# Patient Record
Sex: Male | Born: 1970 | Hispanic: Yes | Smoking: Never smoker
Health system: Southern US, Community
[De-identification: ages and names within clinical notes are randomized; demographics above are authoritative.]

## PROBLEM LIST (undated history)

## (undated) DIAGNOSIS — E119 Type 2 diabetes mellitus without complications: Secondary | ICD-10-CM

---

## 2019-09-24 ENCOUNTER — Telehealth: Payer: Self-pay | Admitting: Pediatric Intensive Care

## 2019-09-24 ENCOUNTER — Telehealth (INDEPENDENT_AMBULATORY_CARE_PROVIDER_SITE_OTHER): Payer: Self-pay

## 2019-09-24 NOTE — Telephone Encounter (Signed)
Message received from [place] requesting appointment for patient to establish care.  Informed her appt scheduled for 10/09/2019 @ 0850 @ [facility]

## 2019-09-24 NOTE — Telephone Encounter (Signed)
Call to client with [facility] interpretation [place]. Client ID verified x2. Client states he had "leg surgery" 20 years ago and was supposed to follow up at 20 year mark for evaluation of hardware. Client denies pain in left lower extremity. Client requests PCP and ortho referral. CN advised that she will make PCP referral and send out [employer] to client and his partner. Client consents to have referral to care via CNP. Lisette Abu RN BSN CNP [phone]

## 2019-09-24 NOTE — Telephone Encounter (Signed)
Call to client with [facility] interpretation [place]- client ID verified x2. Client states that he had "leg surgery" 20 years ago and that he was supposed to have follow up regarding the surgery and hardware at 20 year mark. Cl

## 2019-10-09 ENCOUNTER — Ambulatory Visit (INDEPENDENT_AMBULATORY_CARE_PROVIDER_SITE_OTHER): Payer: Self-pay | Admitting: Primary Care

## 2021-03-20 ENCOUNTER — Emergency Department (HOSPITAL_COMMUNITY)
Admission: EM | Admit: 2021-03-20 | Discharge: 2021-03-20 | Disposition: A | Discharge status: Home / Self Care | Payer: Self-pay | Attending: Emergency Medicine | Admitting: Emergency Medicine

## 2021-03-20 ENCOUNTER — Other Ambulatory Visit: Payer: Self-pay

## 2021-03-20 ENCOUNTER — Emergency Department (HOSPITAL_COMMUNITY): Payer: Self-pay

## 2021-03-20 DIAGNOSIS — Y9289 Other specified places as the place of occurrence of the external cause: Secondary | ICD-10-CM | POA: Insufficient documentation

## 2021-03-20 DIAGNOSIS — W228XXA Striking against or struck by other objects, initial encounter: Secondary | ICD-10-CM | POA: Insufficient documentation

## 2021-03-20 DIAGNOSIS — B029 Zoster without complications: Secondary | ICD-10-CM | POA: Insufficient documentation

## 2021-03-20 DIAGNOSIS — R0789 Other chest pain: Secondary | ICD-10-CM | POA: Insufficient documentation

## 2021-03-20 DIAGNOSIS — R52 Pain, unspecified: Secondary | ICD-10-CM

## 2021-03-20 DIAGNOSIS — Z7984 Long term (current) use of oral hypoglycemic drugs: Secondary | ICD-10-CM | POA: Insufficient documentation

## 2021-03-20 DIAGNOSIS — R21 Rash and other nonspecific skin eruption: Secondary | ICD-10-CM | POA: Insufficient documentation

## 2021-03-20 DIAGNOSIS — R7309 Other abnormal glucose: Secondary | ICD-10-CM | POA: Insufficient documentation

## 2021-03-20 DIAGNOSIS — R030 Elevated blood-pressure reading, without diagnosis of hypertension: Secondary | ICD-10-CM | POA: Insufficient documentation

## 2021-03-20 LAB — CBC
HCT: 48.3 % (ref 39.0–52.0)
Hemoglobin: 16.6 g/dL (ref 13.0–17.0)
MCH: 30.9 pg (ref 26.0–34.0)
MCHC: 34.4 g/dL (ref 30.0–36.0)
MCV: 89.9 fL (ref 80.0–100.0)
Platelets: 284 10*3/uL (ref 150–400)
RBC: 5.37 MIL/uL (ref 4.22–5.81)
RDW: 11.9 % (ref 11.5–15.5)
WBC: 5.8 10*3/uL (ref 4.0–10.5)
nRBC: 0 % (ref 0.0–0.2)

## 2021-03-20 LAB — BASIC METABOLIC PANEL
Anion gap: 7 (ref 5–15)
BUN: 14 mg/dL (ref 6–20)
CO2: 26 mmol/L (ref 22–32)
Calcium: 9.3 mg/dL (ref 8.9–10.3)
Chloride: 100 mmol/L (ref 98–111)
Creatinine, Ser: 0.83 mg/dL (ref 0.61–1.24)
GFR, Estimated: >60 mL/min (ref >60)
Glucose, Bld: 478 mg/dL — ABNORMAL HIGH (ref 70–99)
Potassium: 4 mmol/L (ref 3.5–5.1)
Sodium: 133 mmol/L — ABNORMAL LOW (ref 135–145)

## 2021-03-20 LAB — TROPONIN I (HIGH SENSITIVITY)
Troponin I (High Sensitivity): 4 ng/L (ref <18)
Troponin I (High Sensitivity): 4 ng/L (ref <18)

## 2021-03-20 MED ORDER — NAPROXEN 500 MG PO TABS: 500.0000 mg | ORAL_TABLET | Freq: Two times a day (BID) | ORAL | 0 refills | Status: DC

## 2021-03-20 MED ORDER — VALACYCLOVIR HCL 1 G PO TABS: 1000.0000 mg | ORAL_TABLET | Freq: Three times a day (TID) | ORAL | 0 refills | Status: AC

## 2021-03-20 MED ORDER — VALACYCLOVIR HCL 1 G PO TABS: 1000.0000 mg | ORAL_TABLET | Freq: Three times a day (TID) | ORAL | 0 refills | Status: DC

## 2021-03-20 MED ORDER — METFORMIN HCL 500 MG PO TABS: 500.0000 mg | ORAL_TABLET | Freq: Every day | ORAL | 1 refills | Status: DC

## 2021-03-20 MED ORDER — MORPHINE SULFATE (PF) 4 MG/ML IV SOLN
4.0000 mg | Freq: Once | INTRAVENOUS | Status: AC
Start: 2021-03-20 — End: 2021-03-20
  Administered 2021-03-20: 4 mg via INTRAVENOUS
  Filled 2021-03-20: qty 1

## 2021-03-20 MED ORDER — VALACYCLOVIR HCL 500 MG PO TABS
1000.0000 mg | ORAL_TABLET | Freq: Once | ORAL | Status: AC
  Administered 2021-03-20: 1000 mg via ORAL
  Filled 2021-03-20: qty 2

## 2021-03-20 NOTE — ED Notes (Signed)
Patient transported to X-ray 

## 2021-03-20 NOTE — ED Provider Notes (Signed)
[facility] EMERGENCY DEPARTMENT Provider Note   CSN: [id number] Arrival date & time: 03/20/21  3716     History Chief Complaint  Patient presents with  . Chest Pain  . Rash    Dylan Baxter is a 50 y.o. 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 Patient notes pain is intermittent and worse with palpation. He has taken Advil with moderate relief. Denies central chest pain. Denies associated shortness of breath, diaphoresis, nausea, and vomiting. He has not medical condition; however does not see a PCP. Denies tobacco and drug use. No cardiac history.  Denies history of blood clots, recent surgeries, recent long immobilizations, and hormonal treatments.  No lower extremity edema.   Patient also admits to a pruritic rash on there right side of his back below his shoulder blade that has been present for the past week. He has not received his shingles vaccine. No fever or chills. No treatment prior to arrival.   History obtained from patient and past medical records. Official Engineer, structural used during entire encounter.     No past medical history on file.  There are no problems to display for this patient.     No family history on file.     Home Medications Prior to Admission medications   Medication Sig Start Date End Date Taking? Authorizing Provider  metFORMIN (GLUCOPHAGE) 500 MG tablet Take 1 tablet (500 mg total) by mouth daily. 03/20/21  Yes Daria Mcmeekin C, PA-C  naproxen (NAPROSYN) 500 MG tablet Take 1 tablet (500 mg total) by mouth 2 (two) times daily. 03/20/21  Yes Zeinab Rodwell, Merla Riches, PA-C  valACYclovir (VALTREX) 1000 MG tablet Take 1 tablet (1,000 mg total) by mouth 3 (three) times daily. 03/20/21  Yes Mannie Stabile, PA-C    Allergies    Patient has no known allergies.  Review of Systems   Review of Systems   Constitutional: Negative for chills and fever.  Respiratory: Negative for shortness of breath.   Cardiovascular: Positive for chest pain. Negative for leg swelling.  Gastrointestinal: Negative for abdominal pain, nausea and vomiting.  Skin: Positive for rash.  Neurological: Negative for numbness.  All other systems reviewed and are negative.   Physical Exam Updated Vital Signs BP (!) 162/85   Pulse (!) 57   Temp 98 F (36.7 C) (Oral)   Resp 14   SpO2 98%   Physical Exam Vitals and nursing note reviewed.  Constitutional:      General: He is not in acute distress.    Appearance: He is not ill-appearing.  HENT:     Head: Normocephalic.  Eyes:     Pupils: Pupils are equal, round, and reactive to light.  Cardiovascular:     Rate and Rhythm: Normal rate and regular rhythm.     Pulses: Normal pulses.     Heart sounds: Normal heart sounds. No murmur heard. No friction rub. No gallop.   Pulmonary:     Effort: Pulmonary effort is normal.     Breath sounds: Normal breath sounds.     Comments: Respirations equal and unlabored, patient able to speak in full sentences, lungs clear to auscultation bilaterally Chest:       Comments: Reproducible tenderness throughout right pectoral muscle into ride side of chest wall. No crepitus or deformity. Abdominal:     General: Abdomen is flat. Bowel sounds are  normal. There is no distension.     Palpations: Abdomen is soft.     Tenderness: There is no abdominal tenderness. There is no guarding or rebound.  Musculoskeletal:     Cervical back: Neck supple.     Comments: No lower extremity edema. Negative homan sign bilaterally.  Skin:    Comments: Erythematous papular rash in right thoracic region.  Neurological:     General: No focal deficit present.     Mental Status: He is alert.  Psychiatric:        Mood and Affect: Mood normal.        Behavior: Behavior normal.     ED Results / Procedures / Treatments   Labs (all labs ordered  are listed, but only abnormal results are displayed) Labs Reviewed  BASIC METABOLIC PANEL - Abnormal; Notable for the following components:      Result Value   Sodium 133 (*)    Glucose, Bld 478 (*)    All other components within normal limits  CBC  TROPONIN I (HIGH SENSITIVITY)  TROPONIN I (HIGH SENSITIVITY)    EKG None  Radiology DG Chest 2 View  Result Date: 03/20/2021 CLINICAL DATA:  Chest pain and shortness of breath EXAM: CHEST - 2 VIEW COMPARISON:  None. FINDINGS: Lungs are clear. Heart size and pulmonary vascularity are normal. No adenopathy. There is degenerative change in the midthoracic region. No pneumothorax. IMPRESSION: Lungs clear.  Heart size normal.  No evident adenopathy. Electronically Signed   By: Bretta Bang III M.D.   On: 03/20/2021 09:25   DG Ribs Unilateral Right  Result Date: 03/20/2021 CLINICAL DATA:  Anterior right rib pain since an injury involving a forklift 03/18/2021. EXAM: RIGHT RIBS - 2 VIEW COMPARISON:  None. FINDINGS: No fracture or other bone lesions are seen involving the ribs. IMPRESSION: Negative exam. Electronically Signed   By: Drusilla Kanner M.D.   On: 03/20/2021 13:20    Procedures Procedures   Medications Ordered in ED Medications  morphine 4 MG/ML injection 4 mg (has no administration in time range)  valACYclovir (VALTREX) tablet 1,000 mg (has no administration in time range)    ED Course  I have reviewed the triage vital signs and the nursing notes.  Pertinent labs & imaging results that were available during my care of the patient were reviewed by me and considered in my medical decision making (see chart for details).  Clinical Course as of 03/20/21 1412  Mon Mar 20, 2021  1218 Sodium(!): 133 [CA]  1218 Glucose(!): 478 [CA]  1218 Troponin I (High Sensitivity): 4 [CA]    Clinical Course User Index [CA] Mannie Stabile, PA-C   MDM Rules/Calculators/A&P                         50 year old male presents to the  ED due to right-sided chest pain x2 days after being hit in the chest with a forklift.  Denies associated shortness of breath, nausea, vomiting, diaphoresis.  Patient also admits to a pruritic rash on the right side of his back x1 week.  He has not received his shingles vaccine.  Upon arrival, stable vitals.  Patient hypertensive which has improved throughout patient's ED stay.  Suspect underlying hypertension however, patient does not have a PCP.  Low suspicion for hypertensive urgency/emergency given patient is asymptomatic.  Patient in no acute distress and nontoxic-appearing.  Physical exam significant for reproducible tenderness throughout right pectoral muscle and into right side  of chest wall without crepitus or deformity.  No ecchymosis.  Erythematous, papular rash on right thoracic region of back consistent with shingles.  Routine labs, troponin, chest x-ray, and EKG ordered at triage.  Will obtain unilateral right side rib x-ray to rule out rib fracture given history of trauma.  Morphine given for pain.  CBC unremarkable no leukocytosis and normal hemoglobin.  Initial troponin normal at 4.  Will obtain delta troponin to rule out ACS.  BMP significant for mild hyponatremia 133.  Hyperglycemia at 478 with no anion gap.  Normal renal function.  Suspect underlying diabetes.  Personally reviewed which is negative for signs of pneumonia, pneumothorax, or widened mediastinum.  EKG personally reviewed which demonstrates normal sinus rhythm with no signs of acute ischemia.  Delta troponin flat. Doubt ACS. Suspect pain related to MSK etiology given history of trauma and reproducible nature on exam. Patient not hypoxic or tachycardic. Low suspicion for PE/DVT. Right rib x-ray personally reviewed which is negative for any bony fractures. Patient discharged with pain medication and valacyclovir for shingles. Patient also discharged with metformin. [facility] number given to patient at discharge. Advised patient  to call today to schedule an appointment to establish care. Strict ED precautions discussed with patient. Patient states understanding and agrees to plan. Patient discharged home in no acute distress and stable vitals  Final Clinical Impression(s) / ED Diagnoses Final diagnoses:  Chest wall pain  Herpes zoster without complication  Elevated glucose  Elevated blood pressure reading    Rx / DC Orders ED Discharge Orders         Ordered    valACYclovir (VALTREX) 1000 MG tablet  3 times daily        03/20/21 1402    naproxen (NAPROSYN) 500 MG tablet  2 times daily        03/20/21 1402    metFORMIN (GLUCOPHAGE) 500 MG tablet  Daily        03/20/21 1402           Jesusita Oka 03/20/21 1431    Pollyann Savoy, MD 03/20/21 [phone]

## 2021-03-20 NOTE — ED Triage Notes (Signed)
C/O right sided chest pain started about 3-4 days ago that comes and go. 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 Denies PMH other than hip surgery years ago. C/O rash on back side as well,

## 2021-03-20 NOTE — Discharge Instructions (Addendum)
As discussed, all of your x-rays and labs are reassuring today. Your x-ray did not show any broken ribs.  Your rash is most likely shingles.  I am sending you home with a medication for shingles.  Take 3 times a day for 7 days.  I am also sending you home with a pain medication.  Take twice a day as needed for pain.  Do not mix with other over-the-counter pain medications.  I have included the number for [facility].  Please call today to schedule an appointment to establish care with a primary care doctor.  Your glucose and blood pressure was elevated while here in the ER.  I am sending home with Metformin for possible diabetes. Take daily. Return to the ER for new or worsening symptoms.

## 2021-04-06 ENCOUNTER — Encounter (HOSPITAL_COMMUNITY): Payer: Self-pay

## 2021-04-06 ENCOUNTER — Other Ambulatory Visit: Payer: Self-pay

## 2021-04-06 ENCOUNTER — Emergency Department (HOSPITAL_COMMUNITY): Payer: Self-pay

## 2021-04-06 ENCOUNTER — Emergency Department (HOSPITAL_COMMUNITY)
Admission: EM | Admit: 2021-04-06 | Discharge: 2021-04-07 | Disposition: A | Discharge status: Home / Self Care | Payer: Self-pay | Attending: Emergency Medicine | Admitting: Emergency Medicine

## 2021-04-06 DIAGNOSIS — E119 Type 2 diabetes mellitus without complications: Secondary | ICD-10-CM | POA: Insufficient documentation

## 2021-04-06 DIAGNOSIS — R21 Rash and other nonspecific skin eruption: Secondary | ICD-10-CM | POA: Insufficient documentation

## 2021-04-06 DIAGNOSIS — R0602 Shortness of breath: Secondary | ICD-10-CM | POA: Insufficient documentation

## 2021-04-06 DIAGNOSIS — Z7984 Long term (current) use of oral hypoglycemic drugs: Secondary | ICD-10-CM | POA: Insufficient documentation

## 2021-04-06 DIAGNOSIS — R079 Chest pain, unspecified: Secondary | ICD-10-CM

## 2021-04-06 HISTORY — DX: Type 2 diabetes mellitus without complications: E11.9

## 2021-04-06 LAB — BASIC METABOLIC PANEL
Anion gap: 6 (ref 5–15)
BUN: 13 mg/dL (ref 6–20)
CO2: 26 mmol/L (ref 22–32)
Calcium: 9 mg/dL (ref 8.9–10.3)
Chloride: 102 mmol/L (ref 98–111)
Creatinine, Ser: 0.63 mg/dL (ref 0.61–1.24)
GFR, Estimated: >60 mL/min (ref >60)
Glucose, Bld: 138 mg/dL — ABNORMAL HIGH (ref 70–99)
Potassium: 3.5 mmol/L (ref 3.5–5.1)
Sodium: 134 mmol/L — ABNORMAL LOW (ref 135–145)

## 2021-04-06 LAB — TROPONIN I (HIGH SENSITIVITY): Troponin I (High Sensitivity): 4 ng/L (ref <18)

## 2021-04-06 LAB — CBC
HCT: 43.5 % (ref 39.0–52.0)
Hemoglobin: 15 g/dL (ref 13.0–17.0)
MCH: 31.3 pg (ref 26.0–34.0)
MCHC: 34.5 g/dL (ref 30.0–36.0)
MCV: 90.6 fL (ref 80.0–100.0)
Platelets: 315 10*3/uL (ref 150–400)
RBC: 4.8 MIL/uL (ref 4.22–5.81)
RDW: 12.3 % (ref 11.5–15.5)
WBC: 6.1 10*3/uL (ref 4.0–10.5)
nRBC: 0 % (ref 0.0–0.2)

## 2021-04-06 NOTE — ED Triage Notes (Signed)
Emergency Medicine Provider Triage Evaluation Note  Dylan Baxter , a 50 y.o. male  was evaluated in triage.  Pt complains of chest pain.  Review of Systems  Positive: Chest pain, shortness of breath Negative: Cough, fevers, chills, dizziness, syncope  Physical Exam  BP (!) 180/100 (BP Location: Right Arm)   Pulse 71   Temp 98.2 F (36.8 C)   Resp 15   SpO2 98%  Gen:   Awake, no distress   HEENT:  Atraumatic  Resp:  Normal effort  Cardiac:  Normal rate, reducible right-sided chest wall tenderness Abd:   Nondistended, nontender  MSK:   Moves extremities without difficulty  Neuro:  Speech clear   Medical Decision Making  Medically screening exam initiated at 9:45 PM.  Appropriate orders placed.  Dylan Baxter was informed that the remainder of the evaluation will be completed by another provider, this initial triage assessment does not replace that evaluation, and the importance of remaining in the ED until their evaluation is complete.  Clinical Impression  50 year old male here for chest pain.  Plan for labs, troponin.  He is hypertensive with blood pressure 180/100.  Stable for further evaluation.   Mare Ferrari, PA-C 04/06/21 2148

## 2021-04-06 NOTE — ED Triage Notes (Signed)
Patient reports intermittent R sided chest pain x 3 weeks, was seen for the same and told to come back if it continued, reports some shortness of breath at rest, denies cough and fever

## 2021-04-07 LAB — TROPONIN I (HIGH SENSITIVITY): Troponin I (High Sensitivity): 4 ng/L (ref <18)

## 2021-04-07 LAB — D-DIMER, QUANTITATIVE: D-Dimer, Quant: <0.27 ug/mL-FEU (ref 0.00–0.50)

## 2021-04-07 MED ORDER — METHOCARBAMOL 500 MG PO TABS: 500.0000 mg | ORAL_TABLET | Freq: Two times a day (BID) | ORAL | 0 refills | Status: AC | PRN

## 2021-04-07 MED ORDER — NAPROXEN 500 MG PO TABS: 500.0000 mg | ORAL_TABLET | Freq: Two times a day (BID) | ORAL | 0 refills | Status: AC

## 2021-04-07 NOTE — Discharge Instructions (Addendum)
Toma naproxen 2 veces cada dia por dolor.  Toma robaxin por dolor de los musculos. Ten cuidado, puede causar cansado.  Da Dylan Baxter cita con la clinica debajo.  Regresa a la sala de emergencia con sintomas nuevos.

## 2021-04-07 NOTE — ED Provider Notes (Signed)
[facility] EMERGENCY DEPARTMENT Provider Note   CSN: [id number] Arrival date & time: 04/06/21  2134     History Chief Complaint  Patient presents with  . Chest Pain    Dylan Baxter is a 50 y.o. male presenting for evaluation of chest pain.  Patient states for the past few weeks he has had intermittent chest pain on the right side.  It is worse when he lays down at night and with certain movements of his right arm.  He has been seen in the ER as well as his PCP for this.  He is on medicine now with pain, does not know the name or mechanism.  He is here today because the pain has continued and continues to worsen.  He does have increased pain with inspiration.  He reports associated shortness of breath, although this may be due to pain.  He denies fevers, chills, cough, nausea, vomiting, abdominal pain, urinary symptoms.  He denies leg pain or swelling.  He has a history of diabetes for which he takes medication, no other medical problems.  He denies radiation of pain.  Additional history obtained from chart review.  I reviewed recent ED visit for the same.   HPI     Past Medical History:  Diagnosis Date  . Diabetes mellitus without complication (HCC)     There are no problems to display for this patient.   History reviewed. No pertinent surgical history.     History reviewed. No pertinent family history.  Social History   Tobacco Use  . Smoking status: Never Smoker  . Smokeless tobacco: Never Used  Substance Use Topics  . Alcohol use: Yes    Comment: socially   . Drug use: Never    Home Medications Prior to Admission medications   Medication Sig Start Date End Date Taking? Authorizing Provider  methocarbamol (ROBAXIN) 500 MG tablet Take 1 tablet (500 mg total) by mouth 2 (two) times daily as needed for muscle spasms. 04/07/21  Yes Dusti Tetro, PA-C  metFORMIN (GLUCOPHAGE) 500 MG tablet Take 1 tablet (500 mg total) by mouth daily.  03/20/21   Mannie Stabile, PA-C  naproxen (NAPROSYN) 500 MG tablet Take 1 tablet (500 mg total) by mouth 2 (two) times daily. 04/07/21   Ellianna Ruest, PA-C  valACYclovir (VALTREX) 1000 MG tablet Take 1 tablet (1,000 mg total) by mouth 3 (three) times daily. 03/20/21   Mannie Stabile, PA-C    Allergies    Patient has no known allergies.  Review of Systems   Review of Systems  Respiratory: Positive for shortness of breath.   Cardiovascular: Positive for chest pain.  All other systems reviewed and are negative.   Physical Exam Updated Vital Signs BP (!) 156/88 (BP Location: Left Arm)   Pulse (!) 58   Temp 98.2 F (36.8 C) (Oral)   Resp 18   SpO2 97%   Physical Exam Vitals and nursing note reviewed.  Constitutional:      General: He is not in acute distress.    Appearance: He is well-developed.     Comments: Resting in the bed in NAD  HENT:     Head: Normocephalic and atraumatic.  Eyes:     Conjunctiva/sclera: Conjunctivae normal.     Pupils: Pupils are equal, round, and reactive to light.  Cardiovascular:     Rate and Rhythm: Normal rate and regular rhythm.     Pulses: Normal pulses.  Pulmonary:     Effort:  Pulmonary effort is normal. No respiratory distress.     Breath sounds: Normal breath sounds. No wheezing.  Abdominal:     General: There is no distension.     Palpations: Abdomen is soft. There is no mass.     Tenderness: There is no abdominal tenderness. There is no guarding or rebound.  Musculoskeletal:        General: Normal range of motion.     Cervical back: Normal range of motion and neck supple.       Back:  Skin:    General: Skin is warm and dry.     Capillary Refill: Capillary refill takes less than 2 seconds.     Findings: Rash present.     Comments: Dried rash of the R posterior chest wall.   Neurological:     Mental Status: He is alert and oriented to person, place, and time.     ED Results / Procedures / Treatments   Labs (all  labs ordered are listed, but only abnormal results are displayed) Labs Reviewed  BASIC METABOLIC PANEL - Abnormal; Notable for the following components:      Result Value   Sodium 134 (*)    Glucose, Bld 138 (*)    All other components within normal limits  CBC  D-DIMER, QUANTITATIVE  TROPONIN I (HIGH SENSITIVITY)  TROPONIN I (HIGH SENSITIVITY)    EKG EKG Interpretation  Date/Time:  Thursday April 06 2021 21:43:53 EDT Ventricular Rate:  61 PR Interval:  140 QRS Duration: 92 QT Interval:  404 QTC Calculation: 406 R Axis:   28 Text Interpretation: Normal sinus rhythm Normal ECG Confirmed by Zadie Rhine ([id number]) on 04/07/2021 6:41:59 AM   Radiology DG Chest 2 View  Result Date: 04/06/2021 CLINICAL DATA:  Right-sided chest pain and shortness of breath for 1 month EXAM: CHEST - 2 VIEW COMPARISON:  Radiograph 03/20/2021 FINDINGS: No consolidation, features of edema, pneumothorax, or effusion. Pulmonary vascularity is normally distributed. The cardiomediastinal contours are unremarkable. No acute osseous or soft tissue abnormality. IMPRESSION: No acute cardiopulmonary abnormality. Electronically Signed   By: Kreg Shropshire M.D.   On: 04/06/2021 22:25    Procedures Procedures   Medications Ordered in ED Medications - No data to display  ED Course  I have reviewed the triage vital signs and the nursing notes.  Pertinent labs & imaging results that were available during my care of the patient were reviewed by me and considered in my medical decision making (see chart for details).    MDM Rules/Calculators/A&P                          Pt presenting for evaluation of chest pain. On exam, pt appears nontoxic. He has a dried rash, consistent with resolving shingles.  Worsening pain with movement of his arm in certain positions, likely MSK.  However as patient reports associated shortness of breath and worsening pain with inspiration, consider PE, although less likely.  Labs obtained  in triage interpreted by me, overall reassuring.  Electrolytes stable.  Hemoglobin stable.  Troponin negative.  Chest x-ray viewed interpreted by me, no pneumonia pneumothorax or effusion.  EKG nonischemic.  We will add on D-dimer to rule out PE.  Dimer negative, doubt PE.  Discussed findings with patient.  Discussed MSK pain vs neuralgia s/p shingles.  As I do not know what medication he is on for pain currently, this makes changing or adding to the treatment difficult.  Will  prescribe NSAIDs and muscle relaxers, and have patient call his primary care doctor for further management.  At this time, patient appears safe for discharge.  Return precautions given.  Patient states he understands and agrees to plan.  Final Clinical Impression(s) / ED Diagnoses Final diagnoses:  Right-sided chest pain    Rx / DC Orders ED Discharge Orders         Ordered    naproxen (NAPROSYN) 500 MG tablet  2 times daily        04/07/21 0954    methocarbamol (ROBAXIN) 500 MG tablet  2 times daily PRN        04/07/21 0954           Alveria Apley, PA-C 04/07/21 1009    Lorre Nick, MD 04/08/21 [phone]

## 2022-03-12 IMAGING — DX DG RIBS 2V*R*
4 series · 4 of 4 positions shown · non-contrast
Comparison: None.

CLINICAL DATA: Anterior right rib pain since an injury involving a
forklift 03/18/2021.

EXAM:
RIGHT RIBS - 2 VIEW

[rib pa (1 of 2)]
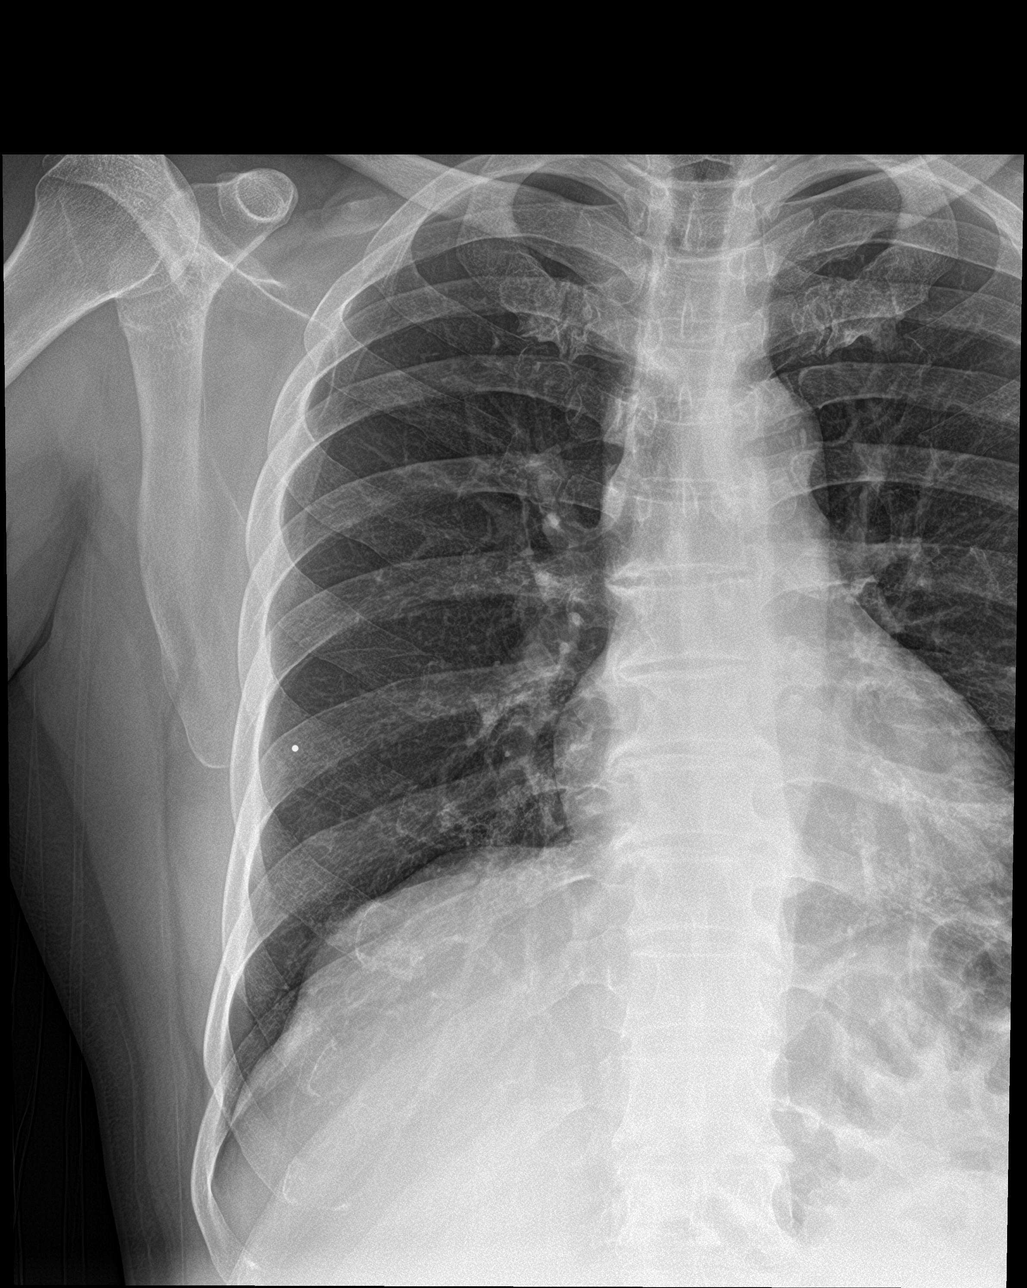

[rib pa obl (1 of 2)]
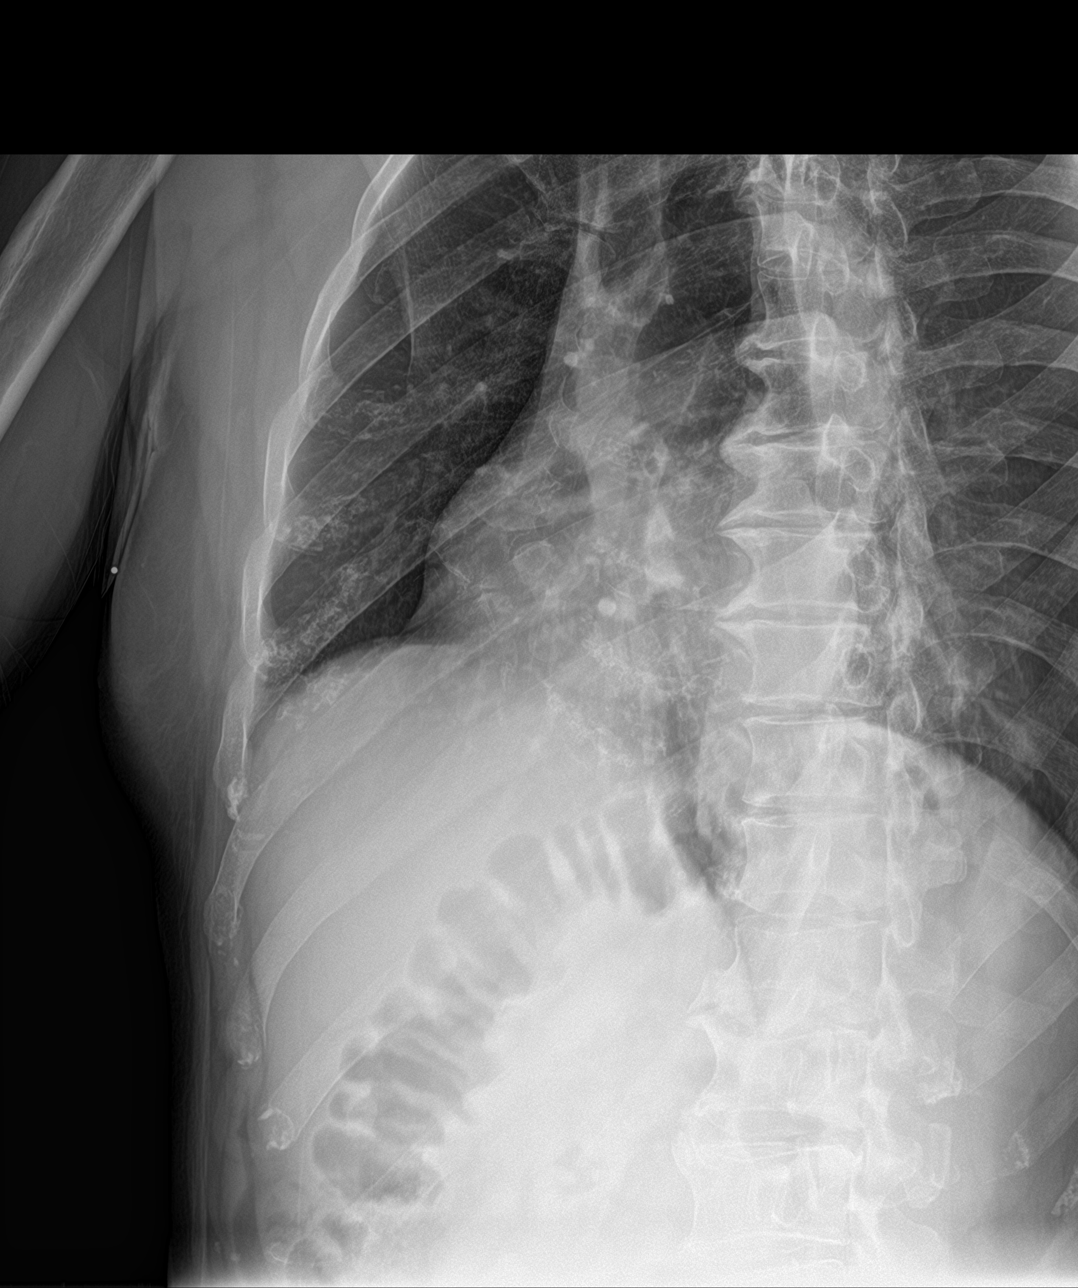

[rib pa obl (2 of 2)]
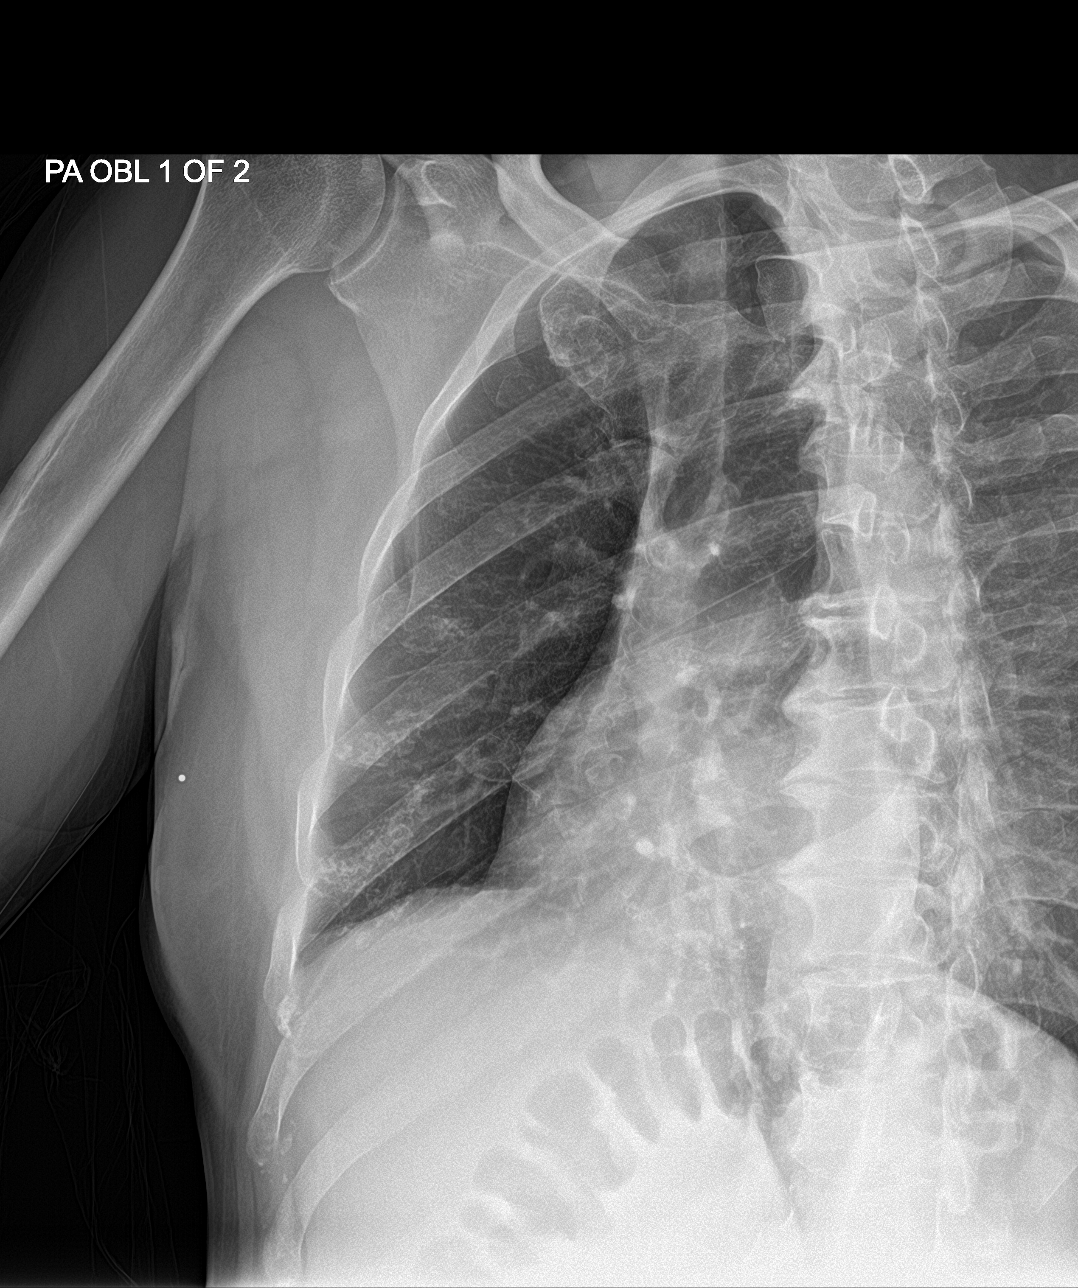

[rib pa (2 of 2)]
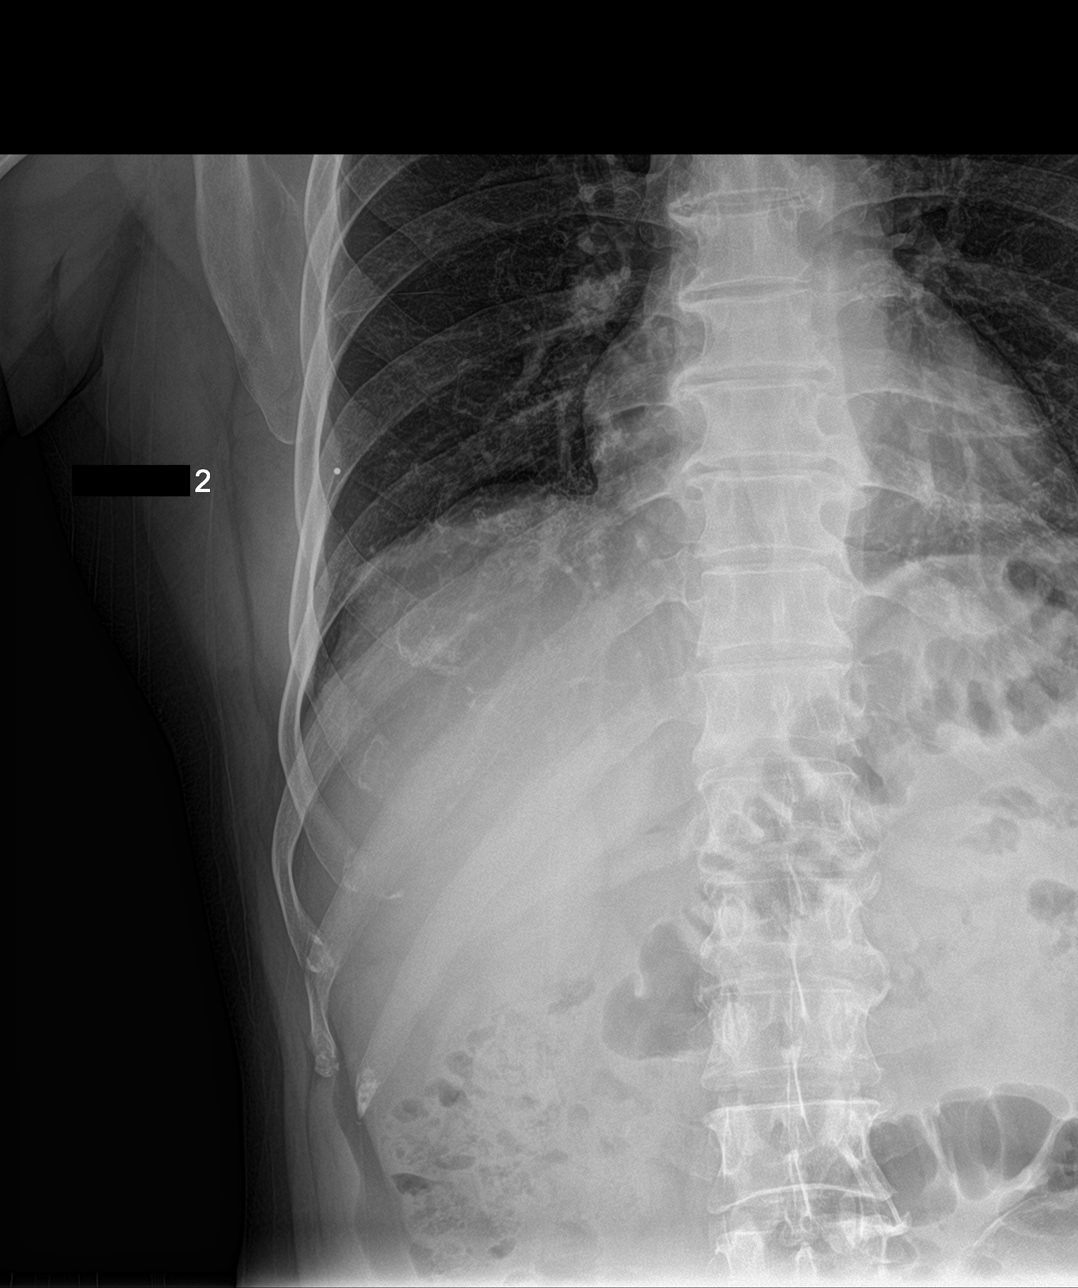

[4 of 4 positions shown; findings below may reference images not displayed]

FINDINGS: No fracture or other bone lesions are seen involving the ribs.
IMPRESSION: Negative exam.

## 2023-12-10 ENCOUNTER — Encounter: Payer: Self-pay | Admitting: Orthopaedic Surgery

## 2023-12-10 ENCOUNTER — Ambulatory Visit: Payer: Self-pay | Admitting: Orthopaedic Surgery

## 2023-12-10 ENCOUNTER — Other Ambulatory Visit: Payer: Self-pay

## 2023-12-10 DIAGNOSIS — M25552 Pain in left hip: Secondary | ICD-10-CM

## 2023-12-10 DIAGNOSIS — M545 Low back pain, unspecified: Secondary | ICD-10-CM

## 2023-12-10 DIAGNOSIS — G8929 Other chronic pain: Secondary | ICD-10-CM
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
## 2024-04-22 ENCOUNTER — Ambulatory Visit (INDEPENDENT_AMBULATORY_CARE_PROVIDER_SITE_OTHER): Payer: Self-pay | Admitting: Nurse Practitioner

## 2024-04-22 ENCOUNTER — Other Ambulatory Visit (HOSPITAL_COMMUNITY): Payer: Self-pay

## 2024-04-22 ENCOUNTER — Encounter: Payer: Self-pay | Admitting: Nurse Practitioner

## 2024-04-22 VITALS — BP 144/72 | HR 59 | Temp 98.8°F | Ht 65.75 in | Wt 175.2 lb

## 2024-04-22 DIAGNOSIS — E119 Type 2 diabetes mellitus without complications: Secondary | ICD-10-CM

## 2024-04-22 DIAGNOSIS — R7301 Impaired fasting glucose: Secondary | ICD-10-CM

## 2024-04-22 DIAGNOSIS — G8929 Other chronic pain: Secondary | ICD-10-CM

## 2024-04-22 DIAGNOSIS — I10 Essential (primary) hypertension: Secondary | ICD-10-CM

## 2024-04-22 DIAGNOSIS — M25552 Pain in left hip: Secondary | ICD-10-CM

## 2024-04-22 LAB — POCT GLYCOSYLATED HEMOGLOBIN (HGB A1C): Hemoglobin A1C: 7.8 % — AB (ref 4.0–5.6)

## 2024-04-22 MED ORDER — GLIPIZIDE ER 10 MG PO TB24
10.0000 mg | ORAL_TABLET | Freq: Two times a day (BID) | ORAL | 2 refills | Status: AC
  Filled 2024-04-22: qty 90, 45d supply, fill #0

## 2024-04-22 MED ORDER — DICLOFENAC SODIUM 1 % EX GEL
2.0000 g | Freq: Four times a day (QID) | CUTANEOUS | 2 refills | Status: AC
  Filled 2024-04-22: qty 100, 13d supply, fill #0

## 2024-04-22 MED ORDER — ATORVASTATIN CALCIUM 40 MG PO TABS
40.0000 mg | ORAL_TABLET | Freq: Every day | ORAL | 2 refills | Status: AC
  Filled 2024-04-22: qty 90, 90d supply, fill #0

## 2024-04-22 MED ORDER — METFORMIN HCL ER 500 MG PO TB24
500.0000 mg | ORAL_TABLET | Freq: Two times a day (BID) | ORAL | 2 refills | Status: AC
  Filled 2024-04-22: qty 60, 30d supply, fill #0

## 2024-04-22 MED ORDER — LISINOPRIL 10 MG PO TABS
10.0000 mg | ORAL_TABLET | Freq: Every day | ORAL | 2 refills | Status: AC
  Filled 2024-04-22: qty 90, 90d supply, fill #0
  Filled 2024-04-22: qty 85, 85d supply, fill #0

## 2024-04-22 MED ORDER — KETOROLAC TROMETHAMINE 60 MG/2ML IM SOLN
60.0000 mg | Freq: Once | INTRAMUSCULAR | Status: AC
  Administered 2024-04-22: 60 mg via INTRAMUSCULAR

## 2024-04-22 NOTE — Progress Notes (Signed)
 Subjective   Patient ID: Dylan Baxter, male    DOB: December 16, 1971, 53 y.o.   MRN: [id number]  Chief Complaint  Patient presents with   Establish Care    Patient would like to get some help with his painful hip    Referring provider: No ref. provider found  Dylan Baxter is a 53 y.o. male with Past Medical History: No date: Diabetes mellitus without complication (HCC)  HPI  Patient presents today to establish care.  He states that he does have chronic left hip pain.  He states that he was in an accident around 20 years ago.  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  This places stress on his hip and causes a lot of pain.  He was seen by orthopedics for this issue in December but states that they were unable to help him.  He would like a new referral placed for second opinion.  Patient does need refill on diabetic medications as well.  A1c in office today was 7.8.  We discussed that we will increase metformin  dosage. Denies f/c/s, n/v/d, hemoptysis, PND, leg swelling Denies chest pain or edema     No Known Allergies   There is no immunization history on file for this patient.  Tobacco History: Social History   Tobacco Use  Smoking Status Never  Smokeless Tobacco Never   Counseling given: Not Answered   Outpatient Encounter Medications as of 04/22/2024  Medication Sig   diclofenac Sodium (VOLTAREN) 1 % GEL Apply 2 g topically 4 (four) times daily.   metFORMIN  (GLUCOPHAGE -XR) 500 MG 24 hr tablet Take 1 tablet (500 mg total) by mouth 2 (two) times daily with a meal.   [DISCONTINUED] atorvastatin (LIPITOR) 40 MG tablet Take 40 mg by mouth at bedtime.   [DISCONTINUED] glipiZIDE (GLUCOTROL XL) 10 MG 24 hr tablet Take 10 mg by mouth 2 (two) times daily.   [DISCONTINUED] lisinopril (ZESTRIL) 10 MG tablet Take 10 mg by mouth daily.   [DISCONTINUED] metFORMIN  (GLUCOPHAGE ) 500 MG tablet Take 1 tablet (500 mg total) by mouth daily.   atorvastatin  (LIPITOR) 40 MG tablet Take 1 tablet (40 mg total) by mouth at bedtime.   glipiZIDE (GLUCOTROL XL) 10 MG 24 hr tablet Take 1 tablet (10 mg total) by mouth 2 (two) times daily.   lisinopril (ZESTRIL) 10 MG tablet Take 1 tablet (10 mg total) by mouth daily.   methocarbamol  (ROBAXIN ) 500 MG tablet Take 1 tablet (500 mg total) by mouth 2 (two) times daily as needed for muscle spasms. (Patient not taking: Reported on 04/22/2024)   naproxen  (NAPROSYN ) 500 MG tablet Take 1 tablet (500 mg total) by mouth 2 (two) times daily. (Patient not taking: Reported on 04/22/2024)   valACYclovir  (VALTREX ) 1000 MG tablet Take 1 tablet (1,000 mg total) by mouth 3 (three) times daily. (Patient not taking: Reported on 04/22/2024)   Facility-Administered Encounter Medications as of 04/22/2024  Medication   ketorolac (TORADOL) injection 60 mg    Review of Systems  Review of Systems  Constitutional: Negative.   HENT: Negative.    Cardiovascular: Negative.   Gastrointestinal: Negative.   Allergic/Immunologic: Negative.   Neurological: Negative.   Psychiatric/Behavioral: Negative.       Objective:   BP (!) 144/72   Pulse (!) 59   Temp 98.8 F (37.1 C) (Oral)   Ht 5' 5.75" (1.67 m)   Wt 175 lb 3.2 oz (79.5 kg)   SpO2 99%  BMI 28.50 kg/m   Wt Readings from Last 5 Encounters:  04/22/24 175 lb 3.2 oz (79.5 kg)     Physical Exam Vitals and nursing note reviewed.  Constitutional:      General: He is not in acute distress.    Appearance: He is well-developed.  Cardiovascular:     Rate and Rhythm: Normal rate and regular rhythm.  Pulmonary:     Effort: Pulmonary effort is normal.     Breath sounds: Normal breath sounds.  Skin:    General: Skin is warm and dry.  Neurological:     Mental Status: He is alert and oriented to person, place, and time.       Assessment & Plan:   Impaired fasting blood sugar -     POCT glycosylated hemoglobin (Hb A1C)  Chronic left hip pain -     Diclofenac Sodium;  Apply 2 g topically 4 (four) times daily.  Dispense: 100 g; Refill: 2 -     Ketorolac Tromethamine -     Ambulatory referral to Orthopedic Surgery  Type 2 diabetes mellitus without complication, without long-term current use of insulin (HCC) -     metFORMIN  HCl ER; Take 1 tablet (500 mg total) by mouth 2 (two) times daily with a meal.  Dispense: 60 tablet; Refill: 2 -     glipiZIDE ER; Take 1 tablet (10 mg total) by mouth 2 (two) times daily.  Dispense: 90 tablet; Refill: 2  Primary hypertension -     Lisinopril; Take 1 tablet (10 mg total) by mouth daily.  Dispense: 90 tablet; Refill: 2  Other orders -     Atorvastatin Calcium; Take 1 tablet (40 mg total) by mouth at bedtime.  Dispense: 90 tablet; Refill: 2     Return in about 3 months (around 07/22/2024) for Physical.   Jerrlyn Morel, NP 04/22/2024

## 2024-06-09 ENCOUNTER — Telehealth: Payer: Self-pay | Admitting: Nurse Practitioner

## 2024-06-09 NOTE — Telephone Encounter (Signed)
 Copied from CRM [id number]. Topic: Referral - Request for Referral >> Jun 09, 2024  9:18 AM Elle L wrote: Did the patient discuss referral with their provider in the last year? Yes  Appointment offered? Yes  Type of order/referral and detailed reason for visit: Orthopedic Surgery as his current one was closed as they were unable to contact him.   Preference of office, provider, location: No preference.   If referral order, have you been seen by this specialty before? Yes but they advised him to seek a second opinion.   Can we respond through MyChart? No

## 2024-06-10 ENCOUNTER — Other Ambulatory Visit: Payer: Self-pay | Admitting: Nurse Practitioner

## 2024-06-10 DIAGNOSIS — G8929 Other chronic pain: Secondary | ICD-10-CM

## 2024-07-22 ENCOUNTER — Encounter: Payer: Self-pay | Admitting: Nurse Practitioner
